# Patient Record
Sex: Male | Born: 1965 | Race: Black or African American | Hispanic: No | Marital: Single | Smoking: Former smoker
Health system: Southern US, Community
[De-identification: ages and names within clinical notes are randomized; demographics above are authoritative.]

## PROBLEM LIST (undated history)

## (undated) ENCOUNTER — Emergency Department (HOSPITAL_COMMUNITY): Discharge status: Absent without leave | Payer: Self-pay

## (undated) DIAGNOSIS — I10 Essential (primary) hypertension: Secondary | ICD-10-CM

## (undated) DIAGNOSIS — G4730 Sleep apnea, unspecified: Secondary | ICD-10-CM

## (undated) DIAGNOSIS — K219 Gastro-esophageal reflux disease without esophagitis: Secondary | ICD-10-CM

## (undated) DIAGNOSIS — T7840XA Allergy, unspecified, initial encounter: Secondary | ICD-10-CM

## (undated) HISTORY — DX: Gastro-esophageal reflux disease without esophagitis: K21.9

## (undated) HISTORY — PX: ENUCLEATION: SHX628

## (undated) HISTORY — DX: Allergy, unspecified, initial encounter: T78.40XA

## (undated) HISTORY — DX: Sleep apnea, unspecified: G47.30

## (undated) HISTORY — PX: ROTATOR CUFF REPAIR: SHX139

## (undated) HISTORY — DX: Essential (primary) hypertension: I10

---

## 2005-09-09 ENCOUNTER — Ambulatory Visit: Payer: Self-pay | Admitting: Family Medicine

## 2005-09-27 ENCOUNTER — Ambulatory Visit: Payer: Self-pay | Admitting: *Deleted

## 2005-09-27 ENCOUNTER — Ambulatory Visit: Payer: Self-pay | Admitting: Family Medicine

## 2005-10-04 ENCOUNTER — Ambulatory Visit: Payer: Self-pay | Admitting: Family Medicine

## 2005-11-07 ENCOUNTER — Ambulatory Visit (HOSPITAL_COMMUNITY): Admission: RE | Admit: 2005-11-07 | Discharge: 2005-11-07 | Payer: Self-pay | Admitting: Family Medicine

## 2005-11-27 ENCOUNTER — Inpatient Hospital Stay (HOSPITAL_COMMUNITY): Admission: AC | Admit: 2005-11-27 | Discharge: 2005-11-30 | Payer: Self-pay

## 2005-12-23 ENCOUNTER — Ambulatory Visit: Payer: Self-pay | Admitting: Family Medicine

## 2006-03-31 ENCOUNTER — Ambulatory Visit: Payer: Self-pay | Admitting: Family Medicine

## 2006-04-13 ENCOUNTER — Ambulatory Visit (HOSPITAL_COMMUNITY): Admission: RE | Admit: 2006-04-13 | Discharge: 2006-04-13 | Payer: Self-pay | Admitting: Family Medicine

## 2006-04-17 ENCOUNTER — Ambulatory Visit: Payer: Self-pay | Admitting: Family Medicine

## 2006-05-01 ENCOUNTER — Ambulatory Visit: Payer: Self-pay | Admitting: Family Medicine

## 2006-08-22 ENCOUNTER — Ambulatory Visit: Payer: Self-pay | Admitting: Family Medicine

## 2006-09-18 ENCOUNTER — Ambulatory Visit: Payer: Self-pay | Admitting: Internal Medicine

## 2006-09-22 ENCOUNTER — Ambulatory Visit: Payer: Self-pay | Admitting: Family Medicine

## 2006-10-04 ENCOUNTER — Ambulatory Visit: Payer: Self-pay | Admitting: Family Medicine

## 2006-10-31 ENCOUNTER — Ambulatory Visit: Payer: Self-pay | Admitting: Family Medicine

## 2007-03-29 DIAGNOSIS — J42 Unspecified chronic bronchitis: Secondary | ICD-10-CM

## 2007-03-29 DIAGNOSIS — H543 Unqualified visual loss, both eyes: Secondary | ICD-10-CM | POA: Insufficient documentation

## 2007-06-01 IMAGING — CR DG CHEST 2V
2 series · 2 of 2 positions shown · non-contrast
Comparison: none

CLINICAL DATA: Recurrent bronchitis.
 CHEST - 2 VIEW:

[w chest pa]
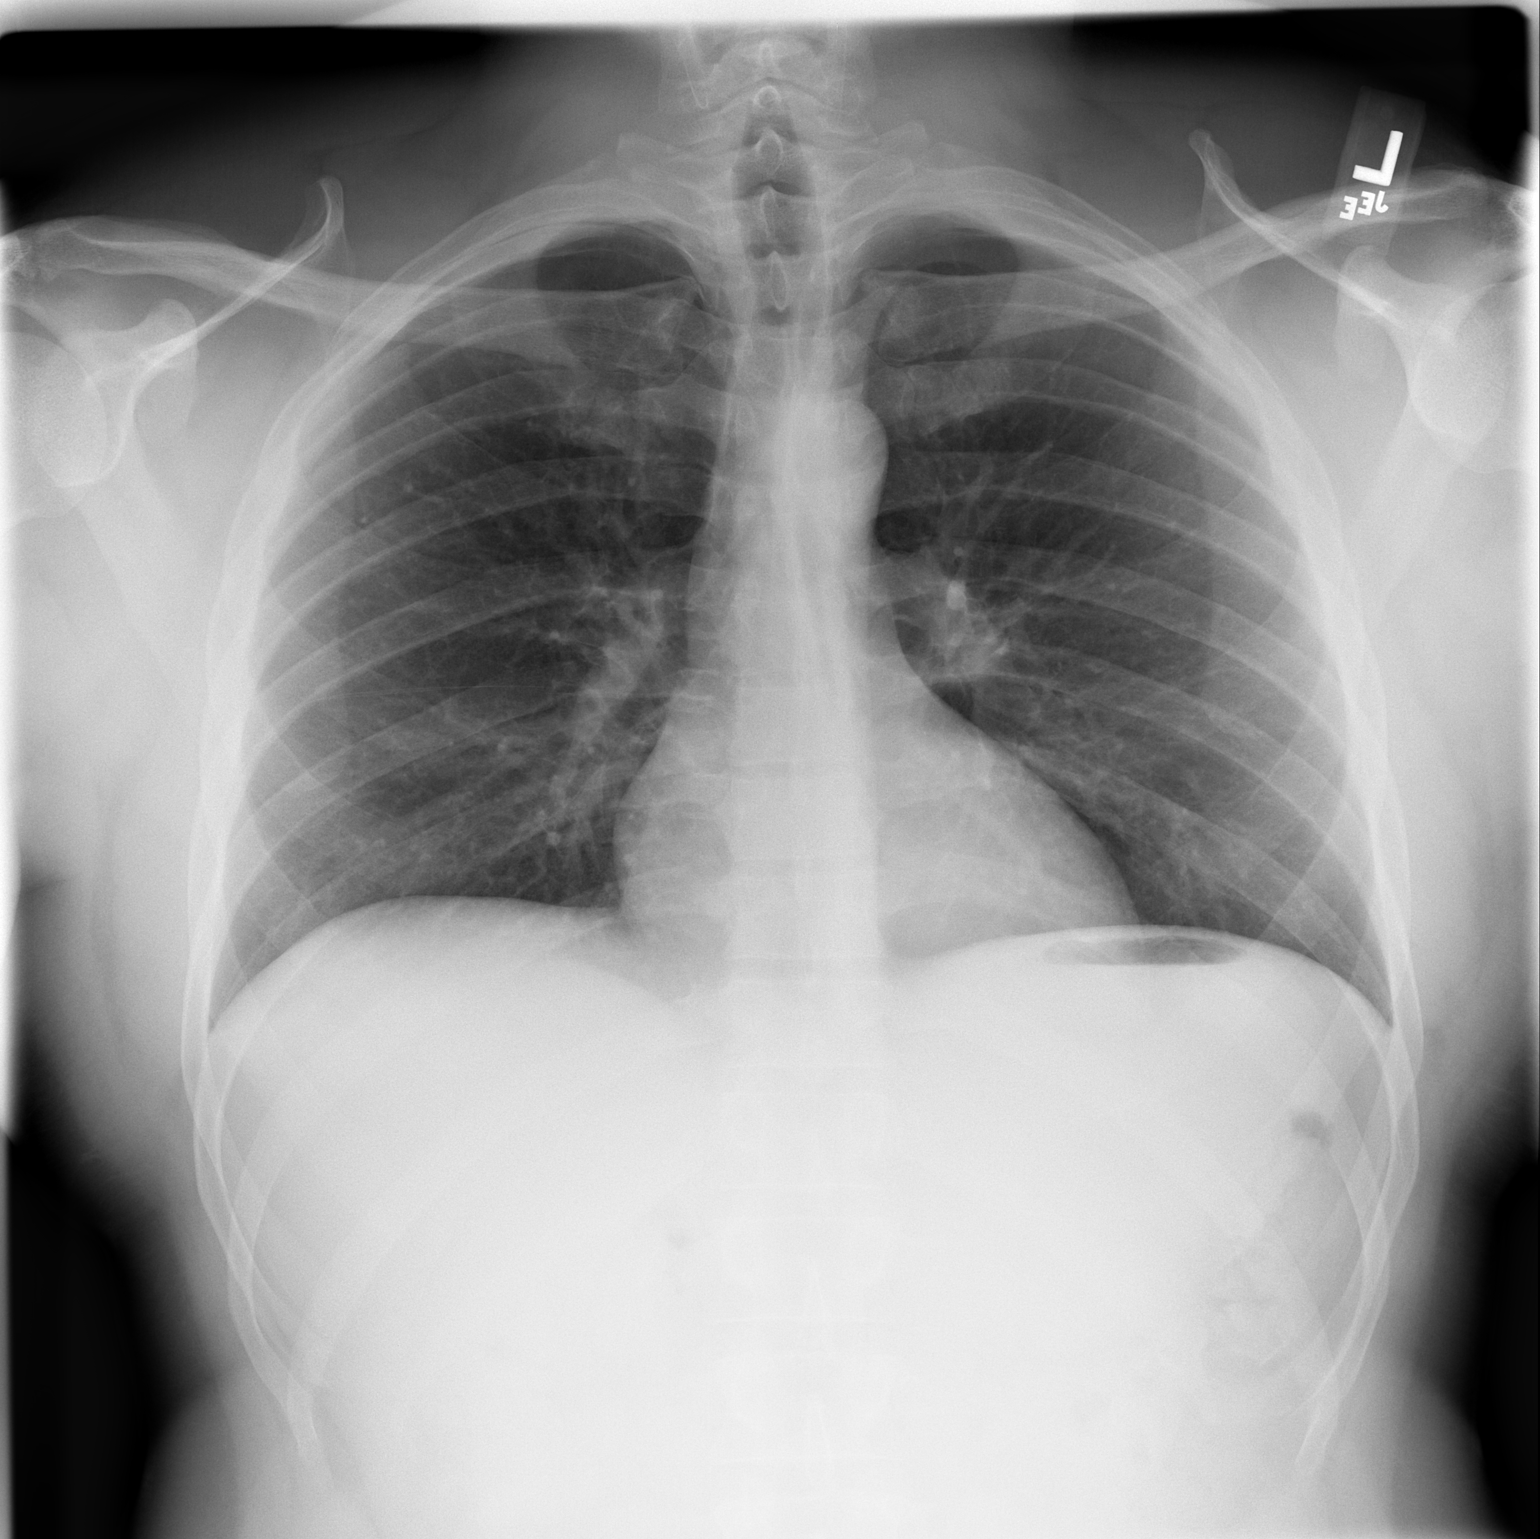

[w chest lat]
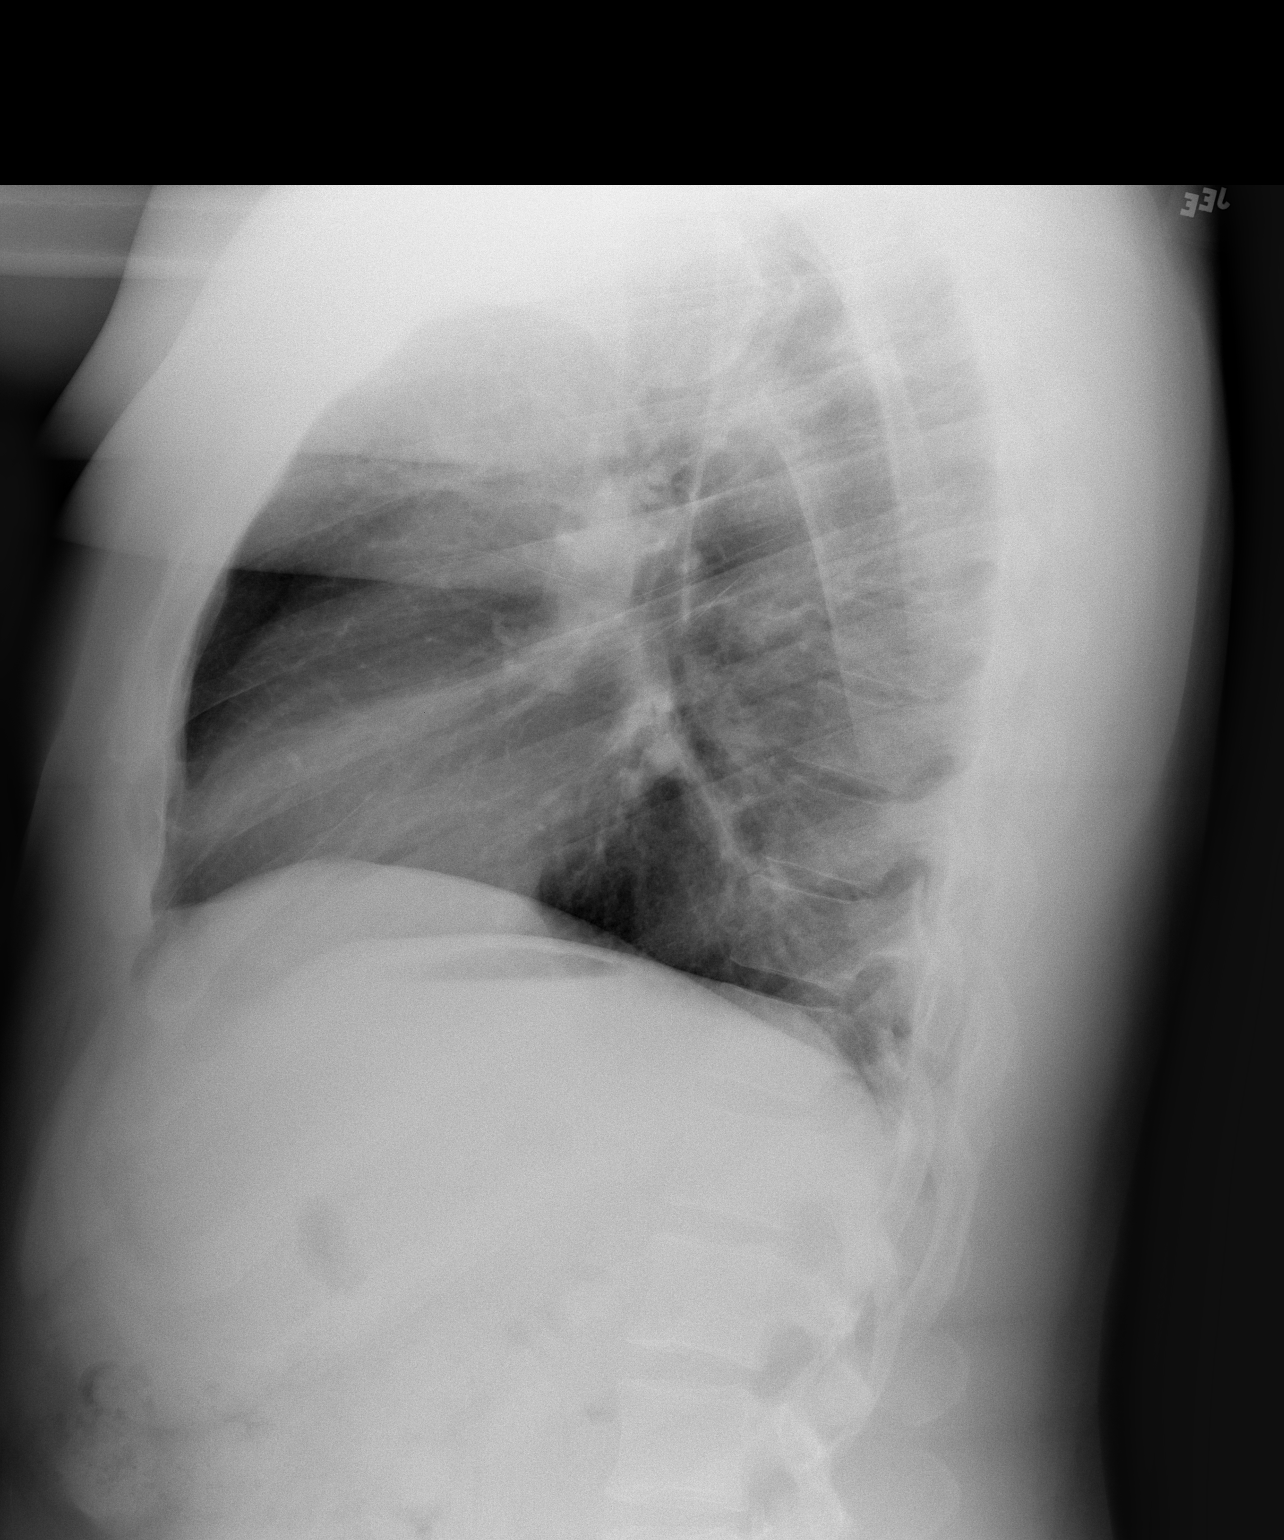

[2 of 2 positions shown; findings below may reference images not displayed]

FINDINGS: Two views of the chest demonstrate clear lungs.  Small amount of gas within stomach.  The trachea is midline.  The heart and mediastinum are within normal limits. Negative for edema or pleural effusion.
IMPRESSION: No acute chest findings.

## 2007-06-06 ENCOUNTER — Encounter (INDEPENDENT_AMBULATORY_CARE_PROVIDER_SITE_OTHER): Payer: Self-pay | Admitting: *Deleted

## 2007-11-14 ENCOUNTER — Ambulatory Visit: Payer: Self-pay | Admitting: Family Medicine

## 2008-04-28 ENCOUNTER — Ambulatory Visit: Payer: Self-pay | Admitting: Internal Medicine

## 2018-07-18 ENCOUNTER — Encounter: Payer: Self-pay | Admitting: Internal Medicine

## 2018-08-08 ENCOUNTER — Other Ambulatory Visit: Payer: Self-pay

## 2018-08-08 ENCOUNTER — Ambulatory Visit (AMBULATORY_SURGERY_CENTER): Payer: Self-pay | Admitting: *Deleted

## 2018-08-08 VITALS — Ht 73.0 in | Wt 248.0 lb

## 2018-08-08 DIAGNOSIS — Z1211 Encounter for screening for malignant neoplasm of colon: Secondary | ICD-10-CM

## 2018-08-08 MED ORDER — PEG 3350-KCL-NA BICARB-NACL 420 G PO SOLR: 4000.0000 mL | Freq: Once | ORAL | 0 refills | Status: AC

## 2018-08-08 NOTE — Progress Notes (Signed)
No egg or soy allergy known to patient  No issues with past sedation with any surgeries  or procedures, no intubation problems  No diet pills per patient No home 02 use per patient  No blood thinners per patient  Pt denies issues with constipation  No A fib or A flutter  EMMI video sent to pt's e mail  

## 2018-08-09 ENCOUNTER — Telehealth: Payer: Self-pay | Admitting: Internal Medicine

## 2018-08-09 DIAGNOSIS — Z1211 Encounter for screening for malignant neoplasm of colon: Secondary | ICD-10-CM

## 2018-08-09 MED ORDER — BISACODYL 5 MG PO TBEC: DELAYED_RELEASE_TABLET | ORAL | 0 refills | Status: DC

## 2018-08-09 MED ORDER — PEG 3350-KCL-NA BICARB-NACL 420 G PO SOLR: 4000.0000 mL | Freq: Once | ORAL | 0 refills | Status: AC

## 2018-08-09 NOTE — Addendum Note (Signed)
Addended by: Lucia GaskinsMYERS, ROBBIN H on: 08/09/2018 04:19 PM   Modules accepted: Orders

## 2018-08-09 NOTE — Telephone Encounter (Signed)
Spoke with patient. He states he does not drive and [facility] He states prep will be mailed to his home. This was sent via computer today. Pt aware.

## 2018-08-29 ENCOUNTER — Ambulatory Visit (AMBULATORY_SURGERY_CENTER): Payer: No Typology Code available for payment source | Admitting: Internal Medicine

## 2018-08-29 ENCOUNTER — Encounter: Payer: Self-pay | Admitting: Internal Medicine

## 2018-08-29 VITALS — BP 117/75 | HR 63 | Temp 97.7°F | Resp 11 | Ht 73.0 in | Wt 248.0 lb

## 2018-08-29 DIAGNOSIS — Z1211 Encounter for screening for malignant neoplasm of colon: Secondary | ICD-10-CM | POA: Diagnosis not present

## 2018-08-29 MED ORDER — SODIUM CHLORIDE 0.9 % IV SOLN: 500.0000 mL | Freq: Once | INTRAVENOUS | Status: AC

## 2018-08-29 NOTE — Op Note (Signed)
[facility] Patient Name: Brett Parker Procedure Date: 08/29/2018 12:10 PM MRN: [id number] Endoscopist: Beverley Fiedler , MD Age: 52 Referring MD:  Date of Birth: [birth year] Gender: Male Account #: [id number] Procedure:                Colonoscopy Indications:              Screening for colorectal malignant neoplasm, This                            is the patient's first colonoscopy Medicines:                Monitored Anesthesia Care Procedure:                Pre-Anesthesia Assessment:                           - Prior to the procedure, a History and Physical                            was performed, and patient medications and                            allergies were reviewed. The patient's tolerance of                            previous anesthesia was also reviewed. The risks                            and benefits of the procedure and the sedation                            options and risks were discussed with the patient.                            All questions were answered, and informed consent                            was obtained. Prior Anticoagulants: The patient has                            taken no previous anticoagulant or antiplatelet                            agents. ASA Grade Assessment: II - A patient with                            mild systemic disease. After reviewing the risks                            and benefits, the patient was deemed in                            satisfactory condition to undergo the procedure.  After obtaining informed consent, the colonoscope                            was passed under direct vision. Throughout the                            procedure, the patient's blood pressure, pulse, and                            oxygen saturations were monitored continuously. The                            Model CF-HQ190L [id number]) scope was introduced                            through the anus and  advanced to the cecum,                            identified by appendiceal orifice and ileocecal                            valve. The colonoscopy was performed without                            difficulty. The patient tolerated the procedure                            well. The quality of the bowel preparation was                            good. The ileocecal valve, appendiceal orifice, and                            rectum were photographed. Scope In: 12:17:48 PM Scope Out: 12:28:42 PM Scope Withdrawal Time: 0 hours 7 minutes 35 seconds  Total Procedure Duration: 0 hours 10 minutes 54 seconds  Findings:                 The digital rectal exam was normal.                           The entire examined colon appeared normal on direct                            and retroflexion views. Complications:            No immediate complications. Estimated Blood Loss:     Estimated blood loss: none. Impression:               - The entire examined colon is normal on direct and                            retroflexion views.                           - No specimens collected. Recommendation:           -  Patient has a contact number available for                            emergencies. The signs and symptoms of potential                            delayed complications were discussed with the                            patient. Return to normal activities tomorrow.                            Written discharge instructions were provided to the                            patient.                           - Resume previous diet.                           - Continue present medications.                           - Repeat colonoscopy in 10 years for screening                            purposes. Beverley FiedlerJay M Arville Postlewaite, MD 08/29/2018 12:31:01 PM This report has been signed electronically.

## 2018-08-29 NOTE — Progress Notes (Signed)
Pt's states no medical or surgical changes since previsit or office visit. 

## 2018-08-29 NOTE — Patient Instructions (Signed)
Thank you for allowing us to care for you today!  Resume previous diet and medications today.  Return to normal activities tomorrow.  Next colonoscopy in 10 years for screening purposes.     YOU HAD AN ENDOSCOPIC PROCEDURE TODAY AT THE [facility]:   Refer to the procedure report that was given to you for any specific questions about what was found during the examination.  If the procedure report does not answer your questions, please call your gastroenterologist to clarify.  If you requested that your care partner not be given the details of your procedure findings, then the procedure report has been included in a sealed envelope for you to review at your convenience later.  YOU SHOULD EXPECT: Some feelings of bloating in the abdomen. Passage of more gas than usual.  Walking can help get rid of the air that was put into your GI tract during the procedure and reduce the bloating. If you had a lower endoscopy (such as a colonoscopy or flexible sigmoidoscopy) you may notice spotting of blood in your stool or on the toilet paper. If you underwent a bowel prep for your procedure, you may not have a normal bowel movement for a few days.  Please Note:  You might notice some irritation and congestion in your nose or some drainage.  This is from the oxygen used during your procedure.  There is no need for concern and it should clear up in a day or so.  SYMPTOMS TO REPORT IMMEDIATELY:   Following lower endoscopy (colonoscopy or flexible sigmoidoscopy):  Excessive amounts of blood in the stool  Significant tenderness or worsening of abdominal pains  Swelling of the abdomen that is new, acute  Fever of 100F or higher   For urgent or emergent issues, a gastroenterologist can be reached at any hour by calling [phone].   DIET:  We do recommend a small meal at first, but then you may proceed to your regular diet.  Drink plenty of fluids but you should avoid alcoholic beverages for  24 hours.  ACTIVITY:  You should plan to take it easy for the rest of today and you should NOT DRIVE or use heavy machinery until tomorrow (because of the sedation medicines used during the test).    FOLLOW UP: Our staff will call the number listed on your records the next business day following your procedure to check on you and address any questions or concerns that you may have regarding the information given to you following your procedure. If we do not reach you, we will leave a message.  However, if you are feeling well and you are not experiencing any problems, there is no need to return our call.  We will assume that you have returned to your regular daily activities without incident.  If any biopsies were taken you will be contacted by phone or by letter within the next 1-3 weeks.  Please call us at [phone] if you have not heard about the biopsies in 3 weeks.    SIGNATURES/CONFIDENTIALITY: You and/or your care partner have signed paperwork which will be entered into your electronic medical record.  These signatures attest to the fact that that the information above on your After Visit Summary has been reviewed and is understood.  Full responsibility of the confidentiality of this discharge information lies with you and/or your care-partner.

## 2018-08-29 NOTE — Progress Notes (Signed)
Report to PACU, RN, vss, BBS= Clear.  

## 2018-08-30 ENCOUNTER — Telehealth: Payer: Self-pay

## 2018-08-30 NOTE — Telephone Encounter (Signed)
  Follow up Call-  Call back number 08/29/2018  Post procedure Call Back phone  # [phone]  Permission to leave phone message Yes  Some recent data might be hidden     Patient questions:  Do you have a fever, pain , or abdominal swelling? No. Pain Score  0 *  Have you tolerated food without any problems? Yes.    Have you been able to return to your normal activities? Yes.    Do you have any questions about your discharge instructions: Diet   No. Medications  No. Follow up visit  No.  Do you have questions or concerns about your Care? No.  Actions: * If pain score is 4 or above: No action needed, pain <4.

## 2024-01-13 ENCOUNTER — Other Ambulatory Visit: Payer: Self-pay | Admitting: Medical Genetics

## 2024-07-10 ENCOUNTER — Other Ambulatory Visit: Payer: Self-pay | Admitting: Medical Genetics

## 2024-07-10 DIAGNOSIS — Z006 Encounter for examination for normal comparison and control in clinical research program: Secondary | ICD-10-CM
